# Patient Record
Sex: Female | Born: 1945 | Race: Black or African American | Hispanic: No | Marital: Single | Smoking: Current every day smoker
Health system: Southern US, Community
[De-identification: ages and names within clinical notes are randomized; demographics above are authoritative.]

## PROBLEM LIST (undated history)

## (undated) DIAGNOSIS — I10 Essential (primary) hypertension: Secondary | ICD-10-CM

## (undated) DIAGNOSIS — E7800 Pure hypercholesterolemia, unspecified: Secondary | ICD-10-CM

## (undated) HISTORY — PX: ABDOMINAL HYSTERECTOMY: SHX81

---

## 2015-02-22 ENCOUNTER — Encounter (HOSPITAL_COMMUNITY): Payer: Self-pay | Admitting: *Deleted

## 2015-02-22 ENCOUNTER — Emergency Department (HOSPITAL_COMMUNITY)
Admission: EM | Admit: 2015-02-22 | Discharge: 2015-02-23 | Disposition: A | Discharge status: Home / Self Care | Payer: Medicare Other | Attending: Emergency Medicine | Admitting: Emergency Medicine

## 2015-02-22 ENCOUNTER — Emergency Department (HOSPITAL_COMMUNITY): Payer: Medicare Other

## 2015-02-22 DIAGNOSIS — Z8639 Personal history of other endocrine, nutritional and metabolic disease: Secondary | ICD-10-CM | POA: Diagnosis not present

## 2015-02-22 DIAGNOSIS — S0990XA Unspecified injury of head, initial encounter: Secondary | ICD-10-CM | POA: Insufficient documentation

## 2015-02-22 DIAGNOSIS — W500XXA Accidental hit or strike by another person, initial encounter: Secondary | ICD-10-CM | POA: Insufficient documentation

## 2015-02-22 DIAGNOSIS — Z720 Tobacco use: Secondary | ICD-10-CM | POA: Diagnosis not present

## 2015-02-22 DIAGNOSIS — Y9289 Other specified places as the place of occurrence of the external cause: Secondary | ICD-10-CM | POA: Insufficient documentation

## 2015-02-22 DIAGNOSIS — W19XXXA Unspecified fall, initial encounter: Secondary | ICD-10-CM

## 2015-02-22 DIAGNOSIS — Y999 Unspecified external cause status: Secondary | ICD-10-CM | POA: Diagnosis not present

## 2015-02-22 DIAGNOSIS — Y9389 Activity, other specified: Secondary | ICD-10-CM | POA: Diagnosis not present

## 2015-02-22 DIAGNOSIS — I10 Essential (primary) hypertension: Secondary | ICD-10-CM | POA: Diagnosis not present

## 2015-02-22 HISTORY — DX: Pure hypercholesterolemia, unspecified: E78.00

## 2015-02-22 HISTORY — DX: Essential (primary) hypertension: I10

## 2015-02-22 MED ORDER — SODIUM CHLORIDE 0.9 % IV BOLUS (SEPSIS)
500.0000 mL | Freq: Once | INTRAVENOUS | Status: AC
  Administered 2015-02-22: 500 mL via INTRAVENOUS

## 2015-02-22 NOTE — ED Notes (Signed)
MD at bedside. 

## 2015-02-22 NOTE — ED Notes (Signed)
Pt to ED via [facility] c/o headache. Pt was at a wedding attempting to catch the bouquet when pt lost her balance, fell, and other people fell on top of her. Denies LOC. Pt was ambulatory initially. Pt began having a headache with photophobia and nausea with headache.  Zofran enroute; VS:134/88, pulse 80, resp 16, 95% on room air,

## 2015-02-22 NOTE — ED Provider Notes (Signed)
CSN: [id number]     Arrival date & time 02/22/15  2248 History   This chart was scribed for [name], MD by Freida Busman, ED Scribe. This patient was seen in room D32C/D32C and the patient's care was started 11:08 PM.    Chief Complaint  Patient presents with  . Head Injury  . Fall    Patient is a 69 y.o. female presenting with head injury and fall. The history is provided by the patient. No language interpreter was used.  Head Injury Location:  Generalized Mechanism of injury: fall   Pain details:    Quality:  Aching   Severity:  Moderate   Timing:  Constant   Progression:  Unchanged Chronicity:  New Relieved by:  None tried Worsened by:  Nothing tried Ineffective treatments:  None tried Associated symptoms: headache   Associated symptoms: no disorientation, no double vision, no focal weakness, no loss of consciousness, no memory loss, no neck pain, no numbness, no seizures and no tinnitus   Risk factors: no obesity   Fall Associated symptoms include headaches. Pertinent negatives include no chest pain and no shortness of breath.     HPI Comments:  Danielle Fletcher is a 69 y.o. female who presents to the Emergency Department complaining of a constant moderate HA that started after a head injury this evening. Pt states she was attempting to catch the bouquet at a wedding when she fell onto the concrete ground and a few people fell on top of her. Pt notes she hit her head on the floor; denies LOC. Pt admits to having some ETOH this evening. No alleviating factors noted.  Past Medical History  Diagnosis Date  . Hypertension   . Hypercholesteremia    Past Surgical History  Procedure Laterality Date  . Abdominal hysterectomy     History reviewed. No pertinent family history. Social History  Substance Use Topics  . Smoking status: Current Every Day Smoker  . Smokeless tobacco: None  . Alcohol Use: Yes   OB History    No data available     Review of Systems   Constitutional: Negative for fever and chills.  HENT: Negative for tinnitus.   Eyes: Negative for double vision.  Respiratory: Negative for shortness of breath.   Cardiovascular: Negative for chest pain.  Musculoskeletal: Negative for neck pain.  Neurological: Positive for headaches. Negative for focal weakness, seizures, loss of consciousness, syncope, weakness and numbness.  Psychiatric/Behavioral: Negative for memory loss.  All other systems reviewed and are negative.   Allergies  Review of patient's allergies indicates no known allergies.  Home Medications   Prior to Admission medications   Not on File   BP 137/66 mmHg  Pulse 98  Temp(Src) 98.3 F (36.8 C) (Oral)  Resp 18  SpO2 99% Physical Exam  Constitutional: She is oriented to person, place, and time. She appears well-developed and well-nourished. No distress.  HENT:  Head: Normocephalic and atraumatic. Head is without raccoon's eyes and without Battle's sign.  Right Ear: No hemotympanum.  Left Ear: No hemotympanum.  Mouth/Throat: Oropharynx is clear and moist.  Moist mucous membranes   Eyes: Conjunctivae and EOM are normal. Pupils are equal, round, and reactive to light.  Neck: Normal range of motion. Neck supple.  Trachea midline  Cardiovascular: Normal rate, regular rhythm, normal heart sounds and intact distal pulses.   2+ DP pulse  Pulmonary/Chest: Effort normal and breath sounds normal. No respiratory distress.  Abdominal: Soft. She exhibits no distension and no mass.  There is no tenderness. There is no rebound and no guarding.  Hyeperactive BS  Musculoskeletal: Normal range of motion. She exhibits no edema or tenderness.  No step off, crepitus or point tendernsss of C and T spine.  Pelvis stable; Hips are well seated   Neurological: She is alert and oriented to person, place, and time. She has normal reflexes. She displays normal reflexes. She exhibits normal muscle tone.  Good lower DTRs    Skin: Skin  is warm and dry.  Psychiatric: She has a normal mood and affect. Her behavior is normal.  Nursing note and vitals reviewed.   ED Course  Procedures  DIAGNOSTIC STUDIES:  Oxygen Saturation is 99% on RA, normal by my interpretation.    COORDINATION OF CARE:  11:11 PM Will order CT head and neck.  Discussed treatment plan with pt at bedside and pt agreed to plan.  Labs Review Labs Reviewed - No data to display  Imaging Review No results found. I have personally reviewed and evaluated these images and lab results as part of my medical decision-making.   EKG Interpretation None      MDM   Final diagnoses:  None    No results found for this or any previous visit. Ct Head Wo Contrast  02/22/2015   CLINICAL DATA:  69 year old female with fall and trauma to the head  EXAM: CT HEAD WITHOUT CONTRAST  CT CERVICAL SPINE WITHOUT CONTRAST  TECHNIQUE: Multidetector CT imaging of the head and cervical spine was performed following the standard protocol without intravenous contrast. Multiplanar CT image reconstructions of the cervical spine were also generated.  COMPARISON:  None.  FINDINGS: CT HEAD FINDINGS  The ventricles and the sulci are appropriate in size for the patient's age. There is no intracranial hemorrhage. No midline shift or mass effect identified. The gray-white matter differentiation is preserved.  The visualized paranasal sinuses and mastoid air cells are well aerated. The calvarium is intact.  CT CERVICAL SPINE FINDINGS  There is no acute fracture or subluxation of the cervical spine.There multilevel degenerative changes in most prominent at C4-C6. There is straightening of the normal cervical lordosis which may be positional or due to muscle spasm.The odontoid and spinous processes are intact.There is normal anatomic alignment of the C1-C2 lateral masses. The visualized soft tissues appear unremarkable.  There biapical scarring. Ill-defined left thyroid nodule. Ultrasound may  provide better evaluation.  IMPRESSION: No acute intracranial pathology.  No acute/traumatic cervical spine pathology.  Left thyroid nodule.   Electronically Signed   By: Elgie Collard M.D.   On: 02/22/2015 23:55   Ct Cervical Spine Wo Contrast  02/22/2015   CLINICAL DATA:  69 year old female with fall and trauma to the head  EXAM: CT HEAD WITHOUT CONTRAST  CT CERVICAL SPINE WITHOUT CONTRAST  TECHNIQUE: Multidetector CT imaging of the head and cervical spine was performed following the standard protocol without intravenous contrast. Multiplanar CT image reconstructions of the cervical spine were also generated.  COMPARISON:  None.  FINDINGS: CT HEAD FINDINGS  The ventricles and the sulci are appropriate in size for the patient's age. There is no intracranial hemorrhage. No midline shift or mass effect identified. The gray-white matter differentiation is preserved.  The visualized paranasal sinuses and mastoid air cells are well aerated. The calvarium is intact.  CT CERVICAL SPINE FINDINGS  There is no acute fracture or subluxation of the cervical spine.There multilevel degenerative changes in most prominent at C4-C6. There is straightening of the normal cervical lordosis  which may be positional or due to muscle spasm.The odontoid and spinous processes are intact.There is normal anatomic alignment of the C1-C2 lateral masses. The visualized soft tissues appear unremarkable.  There biapical scarring. Ill-defined left thyroid nodule. Ultrasound may provide better evaluation.  IMPRESSION: No acute intracranial pathology.  No acute/traumatic cervical spine pathology.  Left thyroid nodule.   Electronically Signed   By: Elgie Collard M.D.   On: 02/22/2015 23:55    Medications  sodium chloride 0.9 % bolus 500 mL (0 mLs Intravenous Stopped 02/23/15 0025)  acetaminophen (TYLENOL) tablet 1,000 mg (1,000 mg Oral Given 02/23/15 0023)  ketorolac (TORADOL) 30 MG/ML injection 30 mg (30 mg Intravenous Given 02/23/15  0023)   Alternate tylenol and ibuprofen drink copious water no alcohol   I personally performed the services described in this documentation, which was scribed in my presence. The recorded information has been reviewed and is accurate.     Cy Blamer, MD 02/23/15 [phone]

## 2015-02-23 DIAGNOSIS — S0990XA Unspecified injury of head, initial encounter: Secondary | ICD-10-CM | POA: Diagnosis not present

## 2015-02-23 MED ORDER — IBUPROFEN 800 MG PO TABS: 800.0000 mg | ORAL_TABLET | Freq: Three times a day (TID) | ORAL | Status: AC

## 2015-02-23 MED ORDER — KETOROLAC TROMETHAMINE 30 MG/ML IJ SOLN
30.0000 mg | Freq: Once | INTRAMUSCULAR | Status: AC
  Administered 2015-02-23: 30 mg via INTRAVENOUS
  Filled 2015-02-23: qty 1

## 2015-02-23 MED ORDER — ACETAMINOPHEN 500 MG PO TABS
1000.0000 mg | ORAL_TABLET | Freq: Once | ORAL | Status: AC
  Administered 2015-02-23: 1000 mg via ORAL
  Filled 2015-02-23: qty 2

## 2015-02-23 NOTE — Discharge Instructions (Signed)

## 2015-02-23 NOTE — ED Notes (Signed)
Pt. Left with all belongings and refused wheelchair. Discharge instructions were reviewed and all questions were answered.  

## 2017-04-16 IMAGING — CT CT CERVICAL SPINE W/O CM
4 of 6 series · 14 of 33 positions shown, 16 images · non-contrast
Comparison: None.

CLINICAL DATA: 69-year-old female with fall and trauma to the head

EXAM:
CT HEAD WITHOUT CONTRAST
CT CERVICAL SPINE WITHOUT CONTRAST
TECHNIQUE: Multidetector CT imaging of the head and cervical spine was
performed following the standard protocol without intravenous
contrast. Multiplanar CT image reconstructions of the cervical spine
were also generated.

[Series 302: soft tissue, idose (2) · axial · 0.35mm/px · z∈[+103,+199]mm · 3 of 97 slices shown]
[im 25/97  soft-tissue]
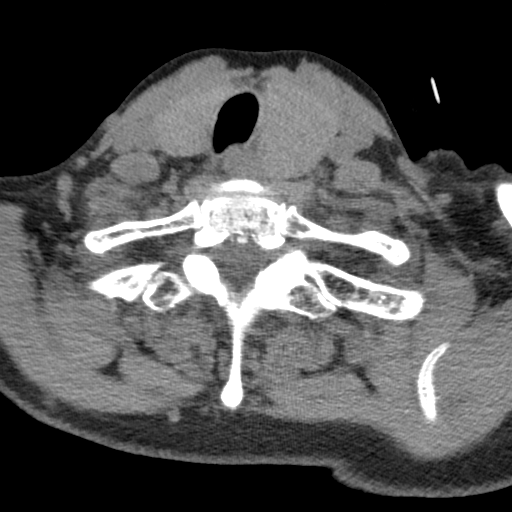
[im 49/97  soft-tissue]
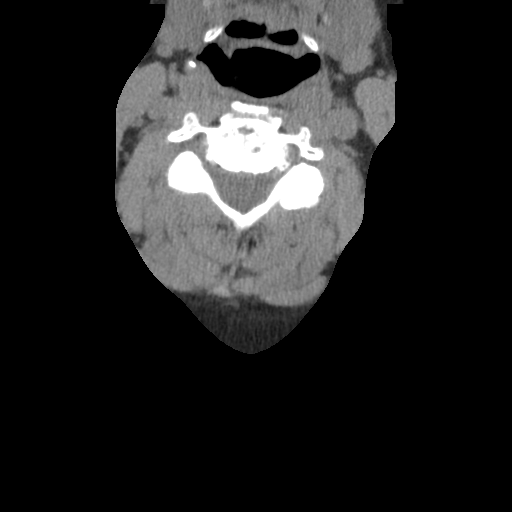
[im 73/97  soft-tissue]
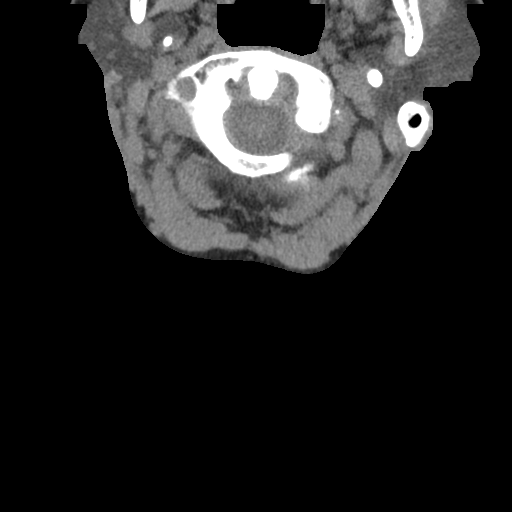

[Series 304: coronal, idose (2) · coronal · 0.38mm/px · 3 of 51 slices shown]
[im 11/51  bone]
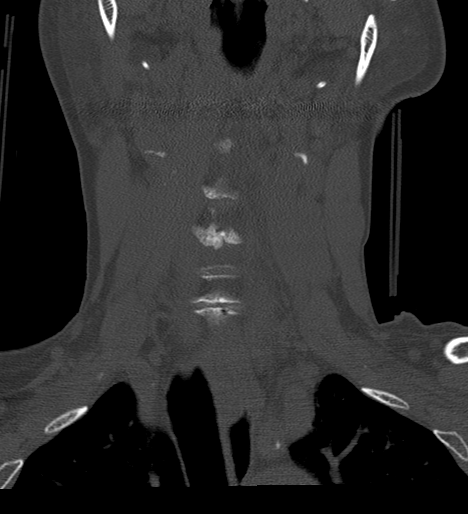
[im 21/51  bone]
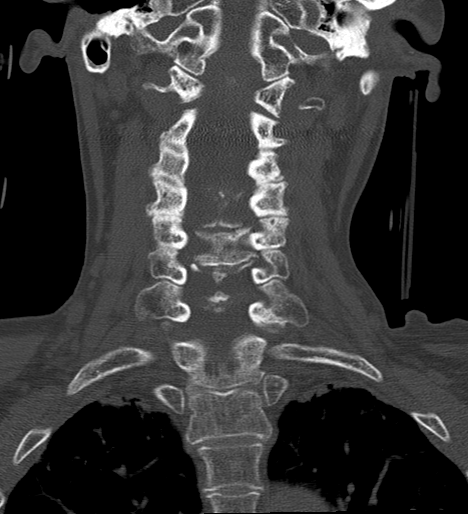
[im 31/51  bone]
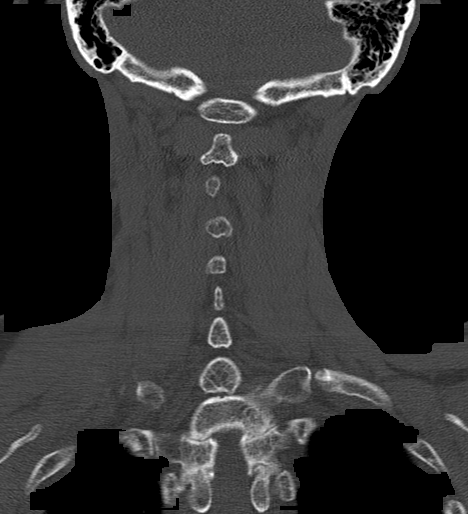

[Series 305: sagittal, idose (2) · sagittal · 0.34mm/px · 5 of 85 slices shown, 6 images]
[im 29/85  bone]
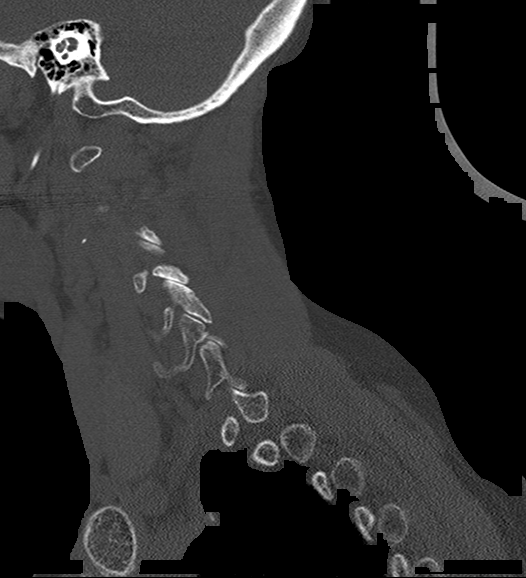
[im 36/85  bone]
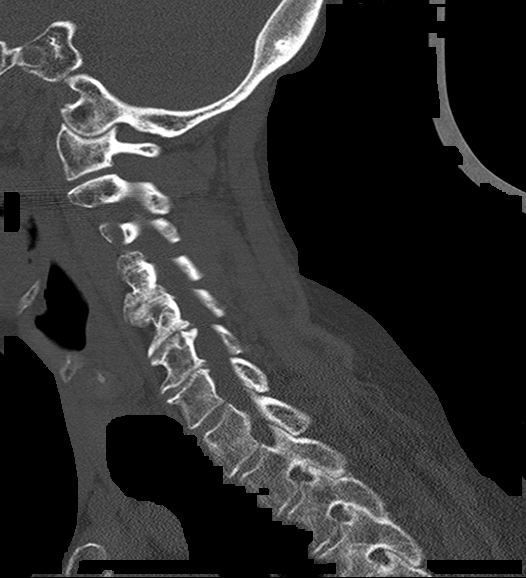
[im 43/85  soft-tissue]
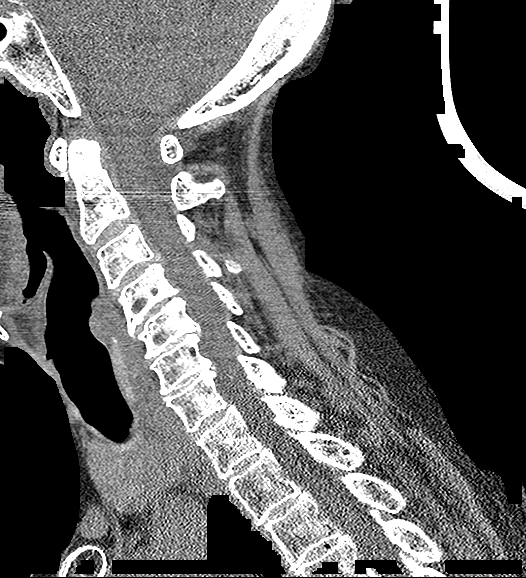
[im 43/85  bone]
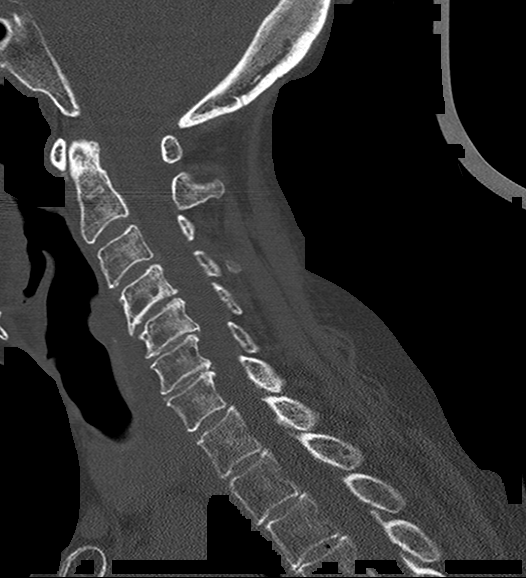
[im 50/85  bone]
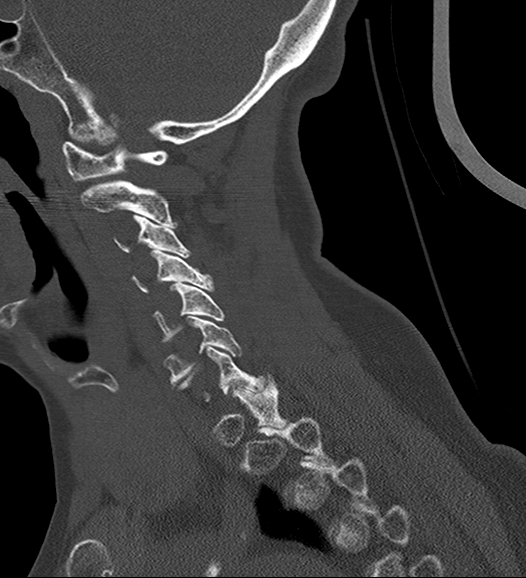
[im 57/85  bone]
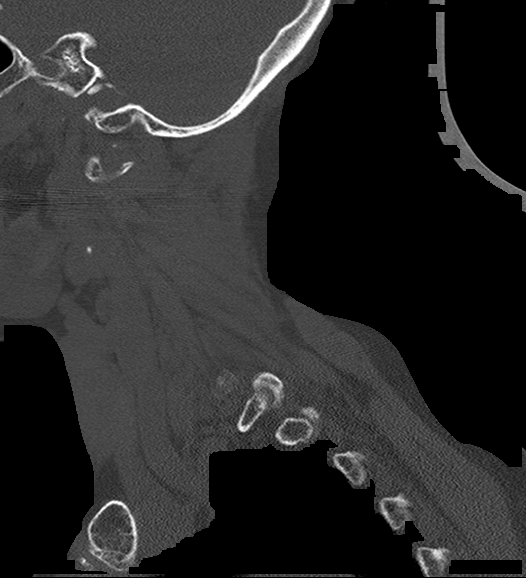

[Series 306: orthogonals, idose (2) · axial · 0.44mm/px · z∈[+73,+158]mm · 3 of 95 slices shown, 4 images]
[im 24/95  soft-tissue]
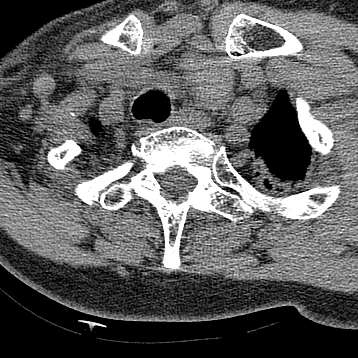
[im 24/95  bone]
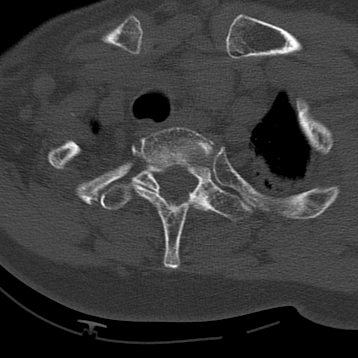
[im 48/95  bone]
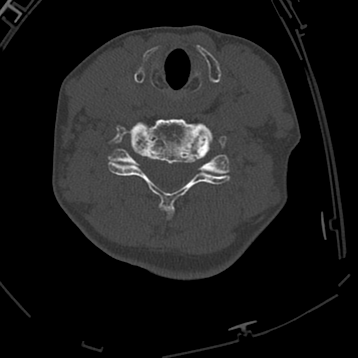
[im 71/95  bone]
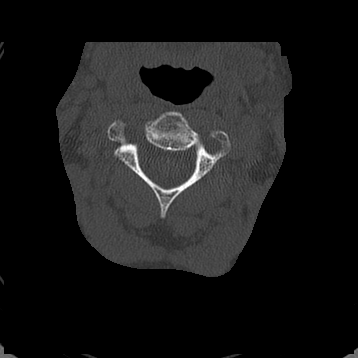

[14 of 33 positions shown; findings below may reference images not displayed]

FINDINGS: CT HEAD FINDINGS

The ventricles and the sulci are appropriate in size for the
patient's age. There is no intracranial hemorrhage. No midline shift
or mass effect identified. The gray-white matter differentiation is
preserved.

The visualized paranasal sinuses and mastoid air cells are well
aerated. The calvarium is intact.

CT CERVICAL SPINE FINDINGS

There is no acute fracture or subluxation of the cervical
spine.There multilevel degenerative changes in most prominent at
C4-C6. There is straightening of the normal cervical lordosis which
may be positional or due to muscle spasm.The odontoid and spinous
processes are intact.There is normal anatomic alignment of the C1-C2
lateral masses. The visualized soft tissues appear unremarkable.

There biapical scarring. Ill-defined left thyroid nodule. Ultrasound
may provide better evaluation.
IMPRESSION: No acute intracranial pathology.

No acute/traumatic cervical spine pathology.

Left thyroid nodule.
